# Patient Record
Sex: Female | Born: 2000 | Race: Black or African American | Hispanic: No | Marital: Single | Smoking: Never smoker
Health system: Southern US, Community
[De-identification: ages and names within clinical notes are randomized; demographics above are authoritative.]

---
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
## 2020-10-14 ENCOUNTER — Ambulatory Visit
Admission: EM | Admit: 2020-10-14 | Discharge: 2020-10-14 | Disposition: A | Discharge status: Home / Self Care | Payer: BC Managed Care – PPO | Attending: Urgent Care | Admitting: Urgent Care

## 2020-10-14 ENCOUNTER — Encounter: Payer: Self-pay | Admitting: *Deleted

## 2020-10-14 DIAGNOSIS — M2569 Stiffness of other specified joint, not elsewhere classified: Secondary | ICD-10-CM | POA: Diagnosis not present

## 2020-10-14 DIAGNOSIS — M545 Low back pain, unspecified: Secondary | ICD-10-CM

## 2020-10-14 MED ORDER — TIZANIDINE HCL 4 MG PO TABS: 4.0000 mg | ORAL_TABLET | Freq: Every day | ORAL | 0 refills | Status: DC

## 2020-10-14 MED ORDER — IBUPROFEN 800 MG PO TABS: 800.0000 mg | ORAL_TABLET | Freq: Three times a day (TID) | ORAL | 0 refills | Status: DC
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
## 2022-04-04 ENCOUNTER — Ambulatory Visit
Admission: EM | Admit: 2022-04-04 | Discharge: 2022-04-04 | Disposition: A | Discharge status: Home / Self Care | Payer: BC Managed Care – PPO | Attending: Physician Assistant | Admitting: Physician Assistant

## 2022-04-04 DIAGNOSIS — H00014 Hordeolum externum left upper eyelid: Secondary | ICD-10-CM | POA: Diagnosis not present

## 2022-04-04 MED ORDER — POLYMYXIN B-TRIMETHOPRIM 10000-0.1 UNIT/ML-% OP SOLN: 1.0000 [drp] | OPHTHALMIC | 0 refills | Status: AC

## 2022-04-04 NOTE — ED Provider Notes (Signed)
[facility]    CSN: [id number] Arrival date & time: 04/04/22  1132      History   Chief Complaint Chief Complaint  Patient presents with   Eye Problem    HPI Yvette Parker is a 22 y.o. female.   Patient here today for evaluation of swelling to her left upper eyelid that has been present for the last few days.  She reports she thinks she might have accidentally touched beauty products to her eye prior to symptoms starting.  She has had recurrent styes in the past.   The history is provided by the patient.  Eye Problem Associated symptoms: no discharge, no nausea, no photophobia, no redness and no vomiting     History reviewed. No pertinent past medical history.  There are no problems to display for this patient.   History reviewed. No pertinent surgical history.  OB History   No obstetric history on file.      Home Medications    Prior to Admission medications   Medication Sig Start Date End Date Taking? Authorizing Provider  trimethoprim-polymyxin b (POLYTRIM) ophthalmic solution Place 1 drop into the right eye every 4 (four) hours for 7 days. 04/04/22 04/11/22 Yes Francene Finders, PA-C  ibuprofen (ADVIL) 800 MG tablet Take 1 tablet (800 mg total) by mouth 3 (three) times daily. 10/14/20   Jaynee Eagles, PA-C  tiZANidine (ZANAFLEX) 4 MG tablet Take 1 tablet (4 mg total) by mouth at bedtime. 10/14/20   Jaynee Eagles, PA-C    Family History Family History  Problem Relation Age of Onset   Hypertension Mother    Hypertension Father     Social History Social History   Tobacco Use   Smoking status: Never   Smokeless tobacco: Never  Vaping Use   Vaping Use: Former  Substance Use Topics   Alcohol use: Yes    Comment: occasionally   Drug use: Never     Allergies   Omnicef [cefdinir]   Review of Systems Review of Systems  Constitutional:  Negative for chills, fatigue and fever.  HENT:  Negative for congestion and rhinorrhea.   Eyes:  Negative for  photophobia, pain, discharge, redness and visual disturbance.  Respiratory:  Negative for shortness of breath.   Gastrointestinal:  Negative for nausea and vomiting.     Physical Exam Triage Vital Signs ED Triage Vitals  Enc Vitals Group     BP 04/04/22 1141 (!) 140/84     Pulse Rate 04/04/22 1141 65     Resp 04/04/22 1141 18     Temp 04/04/22 1141 97.9 F (36.6 C)     Temp Source 04/04/22 1141 Oral     SpO2 04/04/22 1141 97 %     Weight --      Height --      Head Circumference --      Peak Flow --      Pain Score 04/04/22 1140 5     Pain Loc --      Pain Edu? --      Excl. in [place]? --    No data found.  Updated Vital Signs BP (!) 140/84 (BP Location: Left Arm)   Pulse 65   Temp 97.9 F (36.6 C) (Oral)   Resp 18   LMP 03/16/2022   SpO2 97%      Physical Exam Vitals and nursing note reviewed.  Constitutional:      General: She is not in acute distress.    Appearance:  Normal appearance. She is not ill-appearing.  HENT:     Head: Normocephalic and atraumatic.  Eyes:     Extraocular Movements: Extraocular movements intact.     Conjunctiva/sclera: Conjunctivae normal.     Pupils: Pupils are equal, round, and reactive to light.     Comments: Mild swelling and erythema appreciated to left upper eyelid  Cardiovascular:     Rate and Rhythm: Normal rate.  Pulmonary:     Effort: Pulmonary effort is normal. No respiratory distress.  Neurological:     Mental Status: She is alert.  Psychiatric:        Mood and Affect: Mood normal.        Behavior: Behavior normal.        Thought Content: Thought content normal.      UC Treatments / Results  Labs (all labs ordered are listed, but only abnormal results are displayed) Labs Reviewed - No data to display  EKG   Radiology No results found.  Procedures Procedures (including critical care time)  Medications Ordered in UC Medications - No data to display  Initial Impression / Assessment and Plan / UC Course   I have reviewed the triage vital signs and the nursing notes.  Pertinent labs & imaging results that were available during my care of the patient were reviewed by me and considered in my medical decision making (see chart for details).    Discussed suspected stye.  Will treat with antibiotic drops to cover secondary infection and encouraged alternating warm and cold compresses.  Given recurrent symptoms discussed possible evaluation by ophthalmology as well.  Encouraged follow-up with any further concerns.  Final Clinical Impressions(s) / UC Diagnoses   Final diagnoses:  Hordeolum externum of left upper eyelid   Discharge Instructions   None    ED Prescriptions     Medication Sig Dispense Auth. Provider   trimethoprim-polymyxin b (POLYTRIM) ophthalmic solution Place 1 drop into the right eye every 4 (four) hours for 7 days. 10 mL Francene Finders, PA-C      PDMP not reviewed this encounter.   Francene Finders, PA-C 04/04/22 1240

## 2022-04-04 NOTE — ED Triage Notes (Signed)
Pt presents with swelling on left eye with some moderate eye swelling on both bottom lids.

## 2023-03-23 ENCOUNTER — Emergency Department (HOSPITAL_COMMUNITY)
Admission: EM | Admit: 2023-03-23 | Discharge: 2023-03-24 | Disposition: A | Discharge status: Other Acute Inpatient Hospital | Payer: Managed Care, Other (non HMO) | Attending: Emergency Medicine | Admitting: Emergency Medicine

## 2023-03-23 ENCOUNTER — Ambulatory Visit (INDEPENDENT_AMBULATORY_CARE_PROVIDER_SITE_OTHER)
Admission: EM | Admit: 2023-03-23 | Discharge: 2023-03-23 | Disposition: A | Discharge status: Other Acute Inpatient Hospital | Payer: Managed Care, Other (non HMO) | Source: Home / Self Care

## 2023-03-23 DIAGNOSIS — F29 Unspecified psychosis not due to a substance or known physiological condition: Secondary | ICD-10-CM | POA: Diagnosis present

## 2023-03-23 DIAGNOSIS — Z1152 Encounter for screening for COVID-19: Secondary | ICD-10-CM | POA: Insufficient documentation

## 2023-03-23 DIAGNOSIS — R456 Violent behavior: Secondary | ICD-10-CM | POA: Diagnosis present

## 2023-03-23 DIAGNOSIS — Z91010 Allergy to peanuts: Secondary | ICD-10-CM | POA: Diagnosis not present

## 2023-03-23 DIAGNOSIS — Z91040 Latex allergy status: Secondary | ICD-10-CM | POA: Insufficient documentation

## 2023-03-23 DIAGNOSIS — Z046 Encounter for general psychiatric examination, requested by authority: Secondary | ICD-10-CM

## 2023-03-23 DIAGNOSIS — F1290 Cannabis use, unspecified, uncomplicated: Secondary | ICD-10-CM | POA: Insufficient documentation

## 2023-03-23 LAB — COMPREHENSIVE METABOLIC PANEL
ALT: 19 U/L (ref 0–44)
AST: 20 U/L (ref 15–41)
Albumin: 4.1 g/dL (ref 3.5–5.0)
Alkaline Phosphatase: 86 U/L (ref 38–126)
Anion gap: 13 (ref 5–15)
BUN: 7 mg/dL (ref 6–20)
CO2: 22 mmol/L (ref 22–32)
Calcium: 9.7 mg/dL (ref 8.9–10.3)
Chloride: 97 mmol/L — ABNORMAL LOW (ref 98–111)
Creatinine, Ser: 0.86 mg/dL (ref 0.44–1.00)
GFR, Estimated: >60 mL/min (ref >60)
Glucose, Bld: 71 mg/dL (ref 70–99)
Potassium: 4.2 mmol/L (ref 3.5–5.1)
Sodium: 132 mmol/L — ABNORMAL LOW (ref 135–145)
Total Bilirubin: 0.6 mg/dL (ref 0.0–1.2)
Total Protein: 7.9 g/dL (ref 6.5–8.1)

## 2023-03-23 LAB — ETHANOL: Alcohol, Ethyl (B): <10 mg/dL (ref <10)

## 2023-03-23 LAB — POCT URINE DRUG SCREEN - MANUAL ENTRY (I-SCREEN)
POC Amphetamine UR: NOT DETECTED
POC Buprenorphine (BUP): NOT DETECTED
POC Cocaine UR: NOT DETECTED
POC Marijuana UR: POSITIVE — AB
POC Methadone UR: NOT DETECTED
POC Methamphetamine UR: NOT DETECTED
POC Morphine: NOT DETECTED
POC Oxazepam (BZO): NOT DETECTED
POC Oxycodone UR: NOT DETECTED
POC Secobarbital (BAR): NOT DETECTED

## 2023-03-23 LAB — CBC WITH DIFFERENTIAL/PLATELET
Abs Immature Granulocytes: 0.04 10*3/uL (ref 0.00–0.07)
Basophils Absolute: 0 10*3/uL (ref 0.0–0.1)
Basophils Relative: 0 %
Eosinophils Absolute: 0.1 10*3/uL (ref 0.0–0.5)
Eosinophils Relative: 1 %
HCT: 42.8 % (ref 36.0–46.0)
Hemoglobin: 14.3 g/dL (ref 12.0–15.0)
Immature Granulocytes: 0 %
Lymphocytes Relative: 30 %
Lymphs Abs: 3 10*3/uL (ref 0.7–4.0)
MCH: 27.9 pg (ref 26.0–34.0)
MCHC: 33.4 g/dL (ref 30.0–36.0)
MCV: 83.6 fL (ref 80.0–100.0)
Monocytes Absolute: 0.8 10*3/uL (ref 0.1–1.0)
Monocytes Relative: 8 %
Neutro Abs: 6 10*3/uL (ref 1.7–7.7)
Neutrophils Relative %: 61 %
Platelets: 352 10*3/uL (ref 150–400)
RBC: 5.12 MIL/uL — ABNORMAL HIGH (ref 3.87–5.11)
RDW: 14.1 % (ref 11.5–15.5)
WBC: 10 10*3/uL (ref 4.0–10.5)
nRBC: 0 % (ref 0.0–0.2)

## 2023-03-23 LAB — HEMOGLOBIN A1C
Hgb A1c MFr Bld: 6.1 % — ABNORMAL HIGH (ref 4.8–5.6)
Mean Plasma Glucose: 128.37 mg/dL

## 2023-03-23 LAB — LIPID PANEL
Cholesterol: 169 mg/dL (ref 0–200)
HDL: 49 mg/dL (ref >40)
LDL Cholesterol: 107 mg/dL — ABNORMAL HIGH (ref 0–99)
Total CHOL/HDL Ratio: 3.4 {ratio}
Triglycerides: 65 mg/dL (ref <150)
VLDL: 13 mg/dL (ref 0–40)

## 2023-03-23 LAB — POC URINE PREG, ED: Preg Test, Ur: NEGATIVE

## 2023-03-23 LAB — TSH: TSH: 1.793 u[IU]/mL (ref 0.350–4.500)

## 2023-03-23 LAB — POCT PREGNANCY, URINE: Preg Test, Ur: NEGATIVE

## 2023-03-23 MED ORDER — OLANZAPINE 5 MG PO TABS
5.0000 mg | ORAL_TABLET | Freq: Every day | ORAL | Status: DC
  Administered 2023-03-24: 5 mg via ORAL
  Filled 2023-03-23: qty 1

## 2023-03-23 MED ORDER — LORAZEPAM 2 MG/ML IJ SOLN: 2.0000 mg | Freq: Three times a day (TID) | INTRAMUSCULAR | Status: DC | PRN

## 2023-03-23 MED ORDER — HALOPERIDOL LACTATE 5 MG/ML IJ SOLN: 5.0000 mg | Freq: Three times a day (TID) | INTRAMUSCULAR | Status: DC | PRN

## 2023-03-23 MED ORDER — TRAZODONE HCL 50 MG PO TABS: 50.0000 mg | ORAL_TABLET | Freq: Every evening | ORAL | Status: DC | PRN

## 2023-03-23 MED ORDER — LORAZEPAM 2 MG/ML IJ SOLN
2.0000 mg | Freq: Three times a day (TID) | INTRAMUSCULAR | Status: DC | PRN
  Administered 2023-03-23: 2 mg via INTRAMUSCULAR

## 2023-03-23 MED ORDER — ACETAMINOPHEN 325 MG PO TABS: 650.0000 mg | ORAL_TABLET | Freq: Four times a day (QID) | ORAL | Status: DC | PRN

## 2023-03-23 MED ORDER — LORAZEPAM 2 MG/ML IJ SOLN
2.0000 mg | Freq: Three times a day (TID) | INTRAMUSCULAR | Status: DC | PRN
  Filled 2023-03-23: qty 1

## 2023-03-23 MED ORDER — DIPHENHYDRAMINE HCL 50 MG/ML IJ SOLN: 50.0000 mg | Freq: Three times a day (TID) | INTRAMUSCULAR | Status: DC | PRN

## 2023-03-23 MED ORDER — OLANZAPINE 5 MG PO TBDP
5.0000 mg | ORAL_TABLET | Freq: Once | ORAL | Status: AC
  Administered 2023-03-23: 5 mg via ORAL
  Filled 2023-03-23: qty 1

## 2023-03-23 MED ORDER — DIPHENHYDRAMINE HCL 50 MG/ML IJ SOLN
50.0000 mg | Freq: Three times a day (TID) | INTRAMUSCULAR | Status: DC | PRN
  Administered 2023-03-23: 50 mg via INTRAMUSCULAR

## 2023-03-23 MED ORDER — OLANZAPINE 5 MG PO TBDP: 2.5000 mg | ORAL_TABLET | Freq: Once | ORAL | Status: DC

## 2023-03-23 MED ORDER — HALOPERIDOL LACTATE 5 MG/ML IJ SOLN: 10.0000 mg | Freq: Three times a day (TID) | INTRAMUSCULAR | Status: DC | PRN

## 2023-03-23 MED ORDER — ALUM & MAG HYDROXIDE-SIMETH 200-200-20 MG/5ML PO SUSP: 30.0000 mL | ORAL | Status: DC | PRN

## 2023-03-23 MED ORDER — MAGNESIUM HYDROXIDE 400 MG/5ML PO SUSP: 30.0000 mL | Freq: Every day | ORAL | Status: DC | PRN

## 2023-03-23 MED ORDER — DIPHENHYDRAMINE HCL 50 MG/ML IJ SOLN
50.0000 mg | Freq: Three times a day (TID) | INTRAMUSCULAR | Status: DC | PRN
  Filled 2023-03-23: qty 1

## 2023-03-23 MED ORDER — ALBUTEROL SULFATE HFA 108 (90 BASE) MCG/ACT IN AERS: 2.0000 | INHALATION_SPRAY | RESPIRATORY_TRACT | Status: DC | PRN

## 2023-03-23 MED ORDER — OLANZAPINE 5 MG PO TABS: 5.0000 mg | ORAL_TABLET | Freq: Every day | ORAL | Status: DC

## 2023-03-23 MED ORDER — OLANZAPINE 5 MG PO TBDP: 5.0000 mg | ORAL_TABLET | Freq: Three times a day (TID) | ORAL | Status: DC | PRN

## 2023-03-23 MED ORDER — HALOPERIDOL LACTATE 5 MG/ML IJ SOLN
10.0000 mg | Freq: Three times a day (TID) | INTRAMUSCULAR | Status: DC | PRN
  Administered 2023-03-23: 10 mg via INTRAMUSCULAR
  Filled 2023-03-23: qty 2

## 2023-03-23 NOTE — Progress Notes (Signed)
Patient has been denied by [facility] due to no appropriate beds available. Patient meets BH inpatient criteria per Liborio Nixon, NP. Patient has been faxed out to the following facilities:   [facility] [address] [place] [place] [address] [phone]  [facility] [address] [place] [address] [phone]  [facility] [address], [place] [address] [phone]  [facility] EFAX [address] [place] [phone]  [facility] [address], [place] [address] [phone]  [facility] Patient Placement [facility], [place] [phone]  [facility] [address] [place] [address] [phone]  [facility] [address] [place] [address] [phone]  [facility] [address] [place], [place] [address] [phone]  [facility] [address] [place] [address] [phone]  [facility] [address] [place] [address] [phone]  [facility] [address], [place] [address] [phone]  [facility] [address] [place] [address] [phone]  [facility] [address] [place] [address] [phone]  [facility] [address], [place] [address] [phone]  [facility] [address] [place] [address] [phone]  [facility] [address] [place] [address] [phone]  [facility] [place]  [place] [address] [phone]   Damita Dunnings, MSW, LCSW-A  6:42 PM 03/23/2023
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
## 2023-03-23 NOTE — Discharge Instructions (Addendum)
Transfer to [facility]
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
## 2023-03-23 NOTE — Progress Notes (Signed)
Pt is resting quietly. No distress noted or concerns voiced. Staff will monitor for pt's safety. ?

## 2023-03-23 NOTE — BH Assessment (Signed)
This Clinical research associate witnessed patient at approximately 1820 becoming agitated using profanity and could not be redirected. After several attempts were made to verbally deescalate patient began beating on the nurses station window followed by patient reaching through the opening and attempting to pull the phone along with the computer cords back through the window. Security was notified as further verbal deescalating techniques failed. Medications by provider were ordered at that time and patient was moved to flex for the safety of other patients. Security, this Theatre manager were present as multiple attempts were made to try and administer medications by talking to the patient although patient's behaviors continued to escalate as medications were administered.

## 2023-03-23 NOTE — Progress Notes (Signed)
Pt is IVCed and admitted to Continuous observation due to acute psychosis. Pt is alert and oriented to self with signs of AMS. Pt is ambulatory and is oriented to staff/unit. Pt cooperated with labs and skin assessment after a lot of encouragement. Pt's thought process is disorganized and is unable to answer questions appropriately. OTO of Olanzapine 5mg  was ordered and administered. Staff will monitor for pt's safety.

## 2023-03-23 NOTE — BH Assessment (Signed)
Comprehensive Clinical Assessment (CCA) Note  03/23/2023 Yvette Parker [id number]  DISPOSITION: Patient has been recommended for an inpatient admission to assist with stabilization, to note an IVC was initiated by provider soon after arrival due to patient's altered mental status.   The patient demonstrates the following risk factors for suicide: Chronic risk factors for suicide include: N/A. Acute risk factors for suicide include: N/A. Protective factors for this patient include: coping skills. Considering these factors, the overall suicide risk at this point appears to be low. Patient is not appropriate for outpatient follow up.   Patient is a 23 year old female that presents this date with her mother Caliope Ruppert [phone] who assists with providing collateral information. Patient initially presented voluntary although after evaluation it was determined that an IVC be initiated due to patient's altered mental state. Patient denies any SI, HI although reports ongoing AVH (see below provider note for description of content). Patient is observed to be altered at the time of assessment and renders limited information as this writer attempts to interact to gather additional information. Also patient was given medications on arrival and as this writer attempted to interact patient is observed to be drowsy.  Information to complete assessment was gathered from history and provider notes.  Per provider note White NP this date: On evaluation, patient is a poor historian. She is alert and partially oriented to self, and time. She is disoriented to place and states that she is at a pharmacy, although, she's at the [facility]. She is aware that she is in [place], [place]. She is disoriented to the month and states that the month is February, however, she is aware that it is year 2025. Patient is unable to answer questions appropriately and makes nonsensical statements. For example, when asked if she knows why she was  brought here for an evaluation today, she states, "tik-tok brought me here." Furthermore, she tell me that she knows who I am and that I am "Togo." She later tells me that her mother name is "Togo" whose's name is [name]. She expresses feeling paranoid and states, "I knew that bitch was crazy." She reports hearing voices and seeing things of "discernment." Patient noted to be laughing inappropriately on exam. Although, patient is acutely psychotic, she has been cooperative and has not displayed any aggressive behaviors. Due to the patient's inability to complete the assessment consent was obtain from the patient to speak with her mother to obtain collateral information.    I spoke to the patient's mother face to face to obtain collateral information.The patient's mother states that this is not the patient at all. She states that the patient's change in behaviors started either Tuesday or Wednesday of this week. She states that the patient has been super focused scattered and has not been sleeping. She states that the patient has been making statements about she is saved and knows her purpose. She states that on Thursday the patient told her and her husband that she needed to help her friends and was talking really fast, manic-like. She states that the patient has been talking all over the place and not making sense. 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 She states that for the past 2 days the patient has been leaving the house and she has been having to go and look for her. She states that on Saturday  the patient left the house and went to the [facility] which is a recreational center and said that she was talking to God. She states that the patient has not been eating as usual and that she believes she last are Friday night. She states that the patient has been smoking  marijuana for the past 2 to 3 years and that she smokes at least 2-3 times per day. She states that the patient has been focused on saying "tikTok bitches."  She states that the patient also told her you will find out when the time comes I have to complete a mission. She states that this morning the patient jumped into the neighbor's car and refused to get out this morning and that they did not know her whereabouts and was looking for her. She states that she received a phone call from the neighbor this morning around 9:57 AM asking what is wrong with the patient. She states that the patient has never been diagnosed with a psychiatric disorder. She denies a family psychiatric history. I dicussed with the patient's mother that according to the patient's chart she was prescribed prednisone on 1/15 for bronchitis which could induce mania or the patient's marijuana use could induce psychosis. I discussed with the patient's mother placing the patient under involuntary commitment and recommending inpatient psychiatric treatment. The patient's mother is agreeable to the stated plan of care.   IVC filed by this provider.  Respondent presents with altered mental status and her thought process is disorganized. Respondent is unable to answer questions appropriately and states, "tik-tok brought me here." Respondent refers to [name] whose's name is [name]. Respondent expresses feeling paranoid and states, "I knew that bitch was crazy." Respondent reports hearing voices and seeing things of "discernment."  Respondent has been leaving the house and missing over the course of two days. Today, respondent jumped into the neighbor's car and refused to get out.  Meanwhile, the respondent's family was looking for her as they did not know her whereabouts. The respondent has been frequently stating "you will find out when the time comes I have to complete the mission." Respondent has not been sleeping and  leaving the house at night without her family knowing where she is. Respondent is a danger to herself and others.      Chief Complaint:  Chief Complaint  Patient presents with   Altered Mental Status   Visit Diagnosis: Unspecified Psychosis     CCA Screening, Triage and Referral (STR)  Patient Reported Information How did you hear about [place]? Self  What Is the Reason for Your Visit/Call Today? Yvette Parker presents to [facility] voluntarily accompanied by her mother. Pt states that she is here because her mother bought her here. Pt states that her mother thinks that something is wrong. Pt currently denies SI, HI, AH and alcohol/drug use. Pt admits to [facility], stating that she sees a vision of the past, but is trying to use for the future. Pt continues to hold up her locker color sheet, stating that it is her commitment papers. Per mom, pt hasn't slept in 4-5 days and uses marijuana daily. Per mom, pt has been having manic episodes.  How Long Has This Been Causing You Problems? 1 wk - 1 month  What Do You Feel Would Help You the Most Today? Treatment for Depression or other mood problem   Have You Recently Had Any Thoughts About Hurting Yourself? No  Are You Planning to Commit Suicide/Harm  Yourself At This time? No   Flowsheet Row ED from 03/23/2023 in [facility] ED from 04/04/2022 in [facility] at [facility] ED from 10/14/2020 in [facility] at [facility])  C-SSRS RISK CATEGORY No Risk No Risk No Risk       Have you Recently Had Thoughts About Hurting Someone Karolee Ohs? No  Are You Planning to Harm Someone at This Time? No  Explanation: NA   Have You Used Any Alcohol or Drugs in the Past 24 Hours? Yes  How Long Ago Did You Use Drugs or Alcohol? Patient reports in the last 24 hours  What Did You Use and How Much? Pt renders limited information and is observed to be altered at the time of assessment. Patient  reports she, "has only smoked weed once and that was yesterday."   Do You Currently Have a Therapist/Psychiatrist? No  Name of Therapist/Psychiatrist:  NA  Have You Been Recently Discharged From Any Office Practice or Programs? No  Explanation of Discharge From Practice/Program: NA    CCA Screening Triage Referral Assessment Type of Contact: Face-to-Face  Telemedicine Service Delivery: 03/23/2023  Is this Initial or Reassessment? Initial  Date Telepsych consult ordered in [facility]:  03/22/2023  Time Telepsych consult ordered in [facility]: 1400   Location of Assessment: [facility] Assessment Services  Provider Location: [facility] Assessment Services   Collateral Involvement: Mother who is present   Does Patient Have a Automotive engineer Guardian? No  Legal Guardian Contact Information: NA  Copy of Legal Guardianship Form: -- (NA)  Legal Guardian Notified of Arrival: -- (NA)  Legal Guardian Notified of Pending Discharge: -- (NA)  If Minor and Not Living with Parent(s), Who has Custody? NA  Is CPS involved or ever been involved? Never  Is APS involved or ever been involved? Never   Patient Determined To Be At Risk for Harm To Self or Others Based on Review of Patient Reported Information or Presenting Complaint? No  Method: No Plan  Availability of Means: No access or NA  Intent: Vague intent or NA  Notification Required: No need or identified person  Additional Information for Danger to Others Potential: Previous attempts (NA)  Additional Comments for Danger to Others Potential: None noted  Are There Guns or Other Weapons in Your Home? No  Types of Guns/Weapons: NA  Are These Weapons Safely Secured?                            -- (NA)  Who Could Verify You Are Able To Have These Secured: NA  Do You Have any Outstanding Charges, Pending Court Dates, Parole/Probation? Pt denies  Contacted To Inform of Risk of Harm To Self or Others: Other: Comment (NA)    Does  Patient Present under Involuntary Commitment? No (Although IVC was initiated after assessment)    [place] of Residence: [place]   Patient Currently Receiving the Following Services: Not Receiving Services   Determination of Need: Urgent (48 hours)   Options For Referral: Inpatient Hospitalization     CCA Biopsychosocial Patient Reported Schizophrenia/Schizoaffective Diagnosis in Past: No   Strengths: UTA due to patient's AMS on arrival   Mental Health Symptoms Depression:  -- (UTA)   Duration of Depressive symptoms:    Mania:  -- (UTA)   Anxiety:   -- (UTA)   Psychosis:  Hallucinations   Duration of Psychotic symptoms: Duration of  Psychotic Symptoms: Less than six months   Trauma:  None   Obsessions:  None   Compulsions:  None   Inattention:  None   Hyperactivity/Impulsivity:  None   Oppositional/Defiant Behaviors:  None   Emotional Irregularity:  None   Other Mood/Personality Symptoms:  Patient has been altered per mother for last 72 hours    Mental Status Exam Appearance and self-care  Stature:  Average   Weight:  Average weight   Clothing:  Casual   Grooming:  Normal   Cosmetic use:  None   Posture/gait:  Bizarre   Motor activity:  Restless   Sensorium  Attention:  Distractible   Concentration:  Scattered   Orientation:  Person   Recall/memory:  -- (UTA)   Affect and Mood  Affect:  Anxious   Mood:  Anxious   Relating  Eye contact:  Fleeting   Facial expression:  Anxious   Attitude toward examiner:  Cooperative   Thought and Language  Speech flow: Flight of Ideas   Thought content:  Delusions   Preoccupation:  None   Hallucinations:  Auditory; Visual   Organization:  Disorganized   Company secretary of Knowledge:  Poor   Intelligence:  Needs investigation   Abstraction:  Abstract   Judgement:  Impaired   Reality Testing:  Unaware   Insight:  Poor   Decision Making:  Only simple   Social  Functioning  Social Maturity:  Responsible   Social Judgement:  Normal   Stress  Stressors:  -- Industrial/product designer)   Coping Ability:  Overwhelmed   Skill Deficits:  -- Industrial/product designer)   Supports:  Family     Religion: Religion/Spirituality Are You A Religious Person?: No How Might This Affect Treatment?: NA  Leisure/Recreation: Leisure / Recreation Do You Have Hobbies?: No  Exercise/Diet: Exercise/Diet Do You Exercise?: No Have You Gained or Lost A Significant Amount of Weight in the Past Six Months?: No Do You Follow a Special Diet?: No Do You Have Any Trouble Sleeping?: No   CCA Employment/Education Employment/Work Situation: Employment / Work Situation Employment Situation: Employed Work Stressors: Production manager has Been Impacted by Current Illness:  (UTA) Has Patient ever Been in Equities trader?: No  Education: Education Is Patient Currently Attending School?: No Last Grade Completed: 12 Did You Product manager?: Yes What Type of College Degree Do you Have?: Degree in social work Did You Have An Individualized Education Program (IIEP): No Did You Have Any Difficulty At [employer]?: No Patient's Education Has Been Impacted by Current Illness: No   CCA Family/Childhood History Family and Relationship History: Family history Marital status: Single Does patient have children?: No  Childhood History:  Childhood History By whom was/is the patient raised?: Both parents Did patient suffer any verbal/emotional/physical/sexual abuse as a child?: No Did patient suffer from severe childhood neglect?: No Has patient ever been sexually abused/assaulted/raped as an adolescent or adult?: No Was the patient ever a victim of a crime or a disaster?: No Witnessed domestic violence?: No Has patient been affected by domestic violence as an adult?: No       CCA Substance Use Alcohol/Drug Use: Alcohol / Drug Use Pain Medications: See MAR Prescriptions: See MAR Over the Counter: See  MAR History of alcohol / drug use?: Yes Longest period of sobriety (when/how long): UTA Negative Consequences of Use:  (UTA) Withdrawal Symptoms: None Substance #1 Name of Substance 1: Cannabis 1 - Age of First Use: Pt states she "used weed" only once 24  hours ago 1 - Amount (size/oz): UTA 1 - Frequency: UTA 1 - Duration: UTA 1 - Last Use / Amount: In the last 24 hours unknown amount 1 - Method of Aquiring: UTA 1- Route of Use: Smoking                       ASAM's:  Six Dimensions of Multidimensional Assessment  Dimension 1:  Acute Intoxication and/or Withdrawal Potential:   Dimension 1:  Description of individual's past and current experiences of substance use and withdrawal: NA  Dimension 2:  Biomedical Conditions and Complications:   Dimension 2:  Description of patient's biomedical conditions and  complications: NA  Dimension 3:  Emotional, Behavioral, or Cognitive Conditions and Complications:  Dimension 3:  Description of emotional, behavioral, or cognitive conditions and complications: NA  Dimension 4:  Readiness to Change:  Dimension 4:  Description of Readiness to Change criteria: NA  Dimension 5:  Relapse, Continued use, or Continued Problem Potential:  Dimension 5:  Relapse, continued use, or continued problem potential critiera description: NA  Dimension 6:  Recovery/Living Environment:  Dimension 6:  Recovery/Iiving environment criteria description: NA  ASAM Severity Score:    ASAM Recommended Level of Treatment: ASAM Recommended Level of Treatment:  (NA)   Substance use Disorder (SUD) Substance Use Disorder (SUD)  Checklist Symptoms of Substance Use:  (NA)  Recommendations for Services/Supports/Treatments: Recommendations for Services/Supports/Treatments Recommendations For Services/Supports/Treatments: Inpatient Hospitalization  Disposition Recommendation per psychiatric provider: We recommend inpatient psychiatric hospitalization after medical  hospitalization. Patient has been involuntarily committed on 03/23/2023.    DSM5 Diagnoses: There are no active problems to display for this patient.    Referrals to Alternative Service(s): Referred to Alternative Service(s):   Place:   Date:   Time:    Referred to Alternative Service(s):   Place:   Date:   Time:    Referred to Alternative Service(s):   Place:   Date:   Time:    Referred to Alternative Service(s):   Place:   Date:   Time:     Alfredia Ferguson, LCAS

## 2023-03-23 NOTE — Progress Notes (Signed)
Report given Sheria Lang, RN CN [facility].

## 2023-03-23 NOTE — Progress Notes (Cosign Needed Addendum)
Provider was notified that the patient was verbally and physically aggressive on the unit. On evaluation, the patient was observed on the unit pacing yelling, screaming and threatening staff and other patients. Patient was moved to the flex area away from other patients. Patient physically aggressive and was put into a manual hold for chemical restraint and was administered Haldol 10, Ativan 2 and Benadryl 50 intramuscular for severe agitation.  Given the patient's inability to engage in urgent care-level interventions, patient requires a higher level of care for further medical and psychiatric stabilization.  The patient's current presentation exceeds the resources and scope of our urgent care facility. Patient to be transferred to the [facility]. Report called to Dr. Clydene Laming at [facility]. Patient is  under IVC.  Due to patient experiencing first psychotic episode would recommend head CT to rule out organic causes. Patient may be experiencing mania or psychosis from recent prednisone regimen for bronchitis prescribed on 03/12/23.

## 2023-03-23 NOTE — ED Provider Notes (Signed)
[place] EMERGENCY DEPARTMENT AT [facility] Provider Note   CSN: [id number] Arrival date & time: 03/23/23  1914     History  Chief Complaint  Patient presents with   Aggressive Behavior    Yvette Parker is a 23 y.o. female  With new onset psychosis who was seen at behavioral health urgent care earlier today and deemed medically clear.  The patient was went into the emergency department after she became aggressive required Haldol 10 mg 2 of Ativan and 50 about Medrol IM she was transferred to the emergency department because her level of aggression was deemed too high for urgent care facility.  Patient currently sleeping in bed after calming medications.  She is currently under involuntary commitment by previous provider.  HPI     Home Medications Prior to Admission medications   Medication Sig Start Date End Date Taking? Authorizing Provider  albuterol (VENTOLIN HFA) 108 (90 Base) MCG/ACT inhaler Inhale 2 puffs into the lungs every 4 (four) hours as needed for wheezing or shortness of breath. 06/19/21   [provider]      Allergies    Lactose intolerance (gi), Red dye, Omnicef [cefdinir], Latex, and Peanut-containing drug products    Review of Systems   Review of Systems  Physical Exam Updated Vital Signs BP 126/72   Pulse 87   Temp 98.2 F (36.8 C) (Oral)   Resp 18   SpO2 100%  Physical Exam Vitals and nursing note reviewed.  Constitutional:      General: She is not in acute distress.    Appearance: She is well-developed. She is not diaphoretic.     Comments: Patient sleeping soundly in the hallway  HENT:     Head: Normocephalic and atraumatic.     Right Ear: External ear normal.     Left Ear: External ear normal.     Nose: Nose normal.     Mouth/Throat:     Mouth: Mucous membranes are moist.  Eyes:     General: No scleral icterus.    Conjunctiva/sclera: Conjunctivae normal.  Cardiovascular:     Rate and Rhythm: Normal rate and regular  rhythm.     Heart sounds: Normal heart sounds. No murmur heard.    No friction rub. No gallop.  Pulmonary:     Effort: Pulmonary effort is normal. No respiratory distress.     Breath sounds: Normal breath sounds.  Abdominal:     General: Bowel sounds are normal. There is no distension.     Palpations: Abdomen is soft. There is no mass.     Tenderness: There is no abdominal tenderness. There is no guarding.  Musculoskeletal:     Cervical back: Normal range of motion.  Skin:    General: Skin is warm and dry.  Neurological:     Mental Status: She is oriented to person, place, and time.  Psychiatric:        Behavior: Behavior normal.     ED Results / Procedures / Treatments   Labs (all labs ordered are listed, but only abnormal results are displayed) Labs Reviewed - No data to display Results for orders placed or performed during the hospital encounter of 03/23/23  CBC with Differential/Platelet   Collection Time: 03/23/23  1:39 PM  Result Value Ref Range   WBC 10.0 4.0 - 10.5 K/uL   RBC 5.12 (H) 3.87 - 5.11 MIL/uL   Hemoglobin 14.3 12.0 - 15.0 g/dL   HCT 78.2 95.6 - 21.3 %  MCV 83.6 80.0 - 100.0 fL   MCH 27.9 26.0 - 34.0 pg   MCHC 33.4 30.0 - 36.0 g/dL   RDW 18.8 41.6 - 60.6 %   Platelets 352 150 - 400 K/uL   nRBC 0.0 0.0 - 0.2 %   Neutrophils Relative % 61 %   Neutro Abs 6.0 1.7 - 7.7 K/uL   Lymphocytes Relative 30 %   Lymphs Abs 3.0 0.7 - 4.0 K/uL   Monocytes Relative 8 %   Monocytes Absolute 0.8 0.1 - 1.0 K/uL   Eosinophils Relative 1 %   Eosinophils Absolute 0.1 0.0 - 0.5 K/uL   Basophils Relative 0 %   Basophils Absolute 0.0 0.0 - 0.1 K/uL   Immature Granulocytes 0 %   Abs Immature Granulocytes 0.04 0.00 - 0.07 K/uL  Comprehensive metabolic panel   Collection Time: 03/23/23  1:39 PM  Result Value Ref Range   Sodium 132 (L) 135 - 145 mmol/L   Potassium 4.2 3.5 - 5.1 mmol/L   Chloride 97 (L) 98 - 111 mmol/L   CO2 22 22 - 32 mmol/L   Glucose, Bld 71 70 - 99  mg/dL   BUN 7 6 - 20 mg/dL   Creatinine, Ser 3.01 0.44 - 1.00 mg/dL   Calcium 9.7 8.9 - 60.1 mg/dL   Total Protein 7.9 6.5 - 8.1 g/dL   Albumin 4.1 3.5 - 5.0 g/dL   AST 20 15 - 41 U/L   ALT 19 0 - 44 U/L   Alkaline Phosphatase 86 38 - 126 U/L   Total Bilirubin 0.6 0.0 - 1.2 mg/dL   GFR, Estimated >09 >32 mL/min   Anion gap 13 5 - 15  Hemoglobin A1c   Collection Time: 03/23/23  1:39 PM  Result Value Ref Range   Hgb A1c MFr Bld 6.1 (H) 4.8 - 5.6 %   Mean Plasma Glucose 128.37 mg/dL  Ethanol   Collection Time: 03/23/23  1:39 PM  Result Value Ref Range   Alcohol, Ethyl (B) <10 <10 mg/dL  Lipid panel   Collection Time: 03/23/23  1:39 PM  Result Value Ref Range   Cholesterol 169 0 - 200 mg/dL   Triglycerides 65 <355 mg/dL   HDL 49 >73 mg/dL   Total CHOL/HDL Ratio 3.4 RATIO   VLDL 13 0 - 40 mg/dL   LDL Cholesterol 220 (H) 0 - 99 mg/dL  TSH   Collection Time: 03/23/23  1:39 PM  Result Value Ref Range   TSH 1.793 0.350 - 4.500 uIU/mL  POCT Urine Drug Screen - (I-Screen)   Collection Time: 03/23/23  1:46 PM  Result Value Ref Range   POC Amphetamine UR None Detected NONE DETECTED (Cut Off Level 1000 ng/mL)   POC Secobarbital (BAR) None Detected NONE DETECTED (Cut Off Level 300 ng/mL)   POC Buprenorphine (BUP) None Detected NONE DETECTED (Cut Off Level 10 ng/mL)   POC Oxazepam (BZO) None Detected NONE DETECTED (Cut Off Level 300 ng/mL)   POC Cocaine UR None Detected NONE DETECTED (Cut Off Level 300 ng/mL)   POC Methamphetamine UR None Detected NONE DETECTED (Cut Off Level 1000 ng/mL)   POC Morphine None Detected NONE DETECTED (Cut Off Level 300 ng/mL)   POC Methadone UR None Detected NONE DETECTED (Cut Off Level 300 ng/mL)   POC Oxycodone UR None Detected NONE DETECTED (Cut Off Level 100 ng/mL)   POC Marijuana UR Positive (A) NONE DETECTED (Cut Off Level 50 ng/mL)  POC urine preg, ED   Collection Time:  03/23/23  1:47 PM  Result Value Ref Range   Preg Test, Ur Negative Negative   Pregnancy, urine POC   Collection Time: 03/23/23  1:49 PM  Result Value Ref Range   Preg Test, Ur NEGATIVE NEGATIVE    EKG None  Radiology No results found.  Procedures Procedures    Medications Ordered in ED Medications  OLANZapine (ZYPREXA) tablet 5 mg (has no administration in time range)  acetaminophen (TYLENOL) tablet 650 mg (has no administration in time range)  alum & mag hydroxide-simeth (MAALOX/MYLANTA) 200-200-20 MG/5ML suspension 30 mL (has no administration in time range)  magnesium hydroxide (MILK OF MAGNESIA) suspension 30 mL (has no administration in time range)  traZODone (DESYREL) tablet 50 mg (has no administration in time range)  haloperidol lactate (HALDOL) injection 10 mg (has no administration in time range)    And  diphenhydrAMINE (BENADRYL) injection 50 mg (has no administration in time range)    And  LORazepam (ATIVAN) injection 2 mg (has no administration in time range)  haloperidol lactate (HALDOL) injection 5 mg (has no administration in time range)    And  diphenhydrAMINE (BENADRYL) injection 50 mg (has no administration in time range)    And  LORazepam (ATIVAN) injection 2 mg (has no administration in time range)  OLANZapine zydis (ZYPREXA) disintegrating tablet 5 mg (has no administration in time range)    ED Course/ Medical Decision Making/ A&P                                 Medical Decision Making  Patient's labs were reviewed.  She will likely need psychiatric stabilization.  She is currently under involuntary commitment.  She is medically clear.        Final Clinical Impression(s) / ED Diagnoses Final diagnoses:  None    Rx / DC Orders ED Discharge Orders          Ordered    Place in psych hold        03/23/23 2219              Arthor Captain, PA-C 03/23/23 2228    Alvira Monday, MD 03/24/23 [phone]
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
## 2023-03-23 NOTE — ED Triage Notes (Signed)
Pt BIB [facility] from [facility]. Pt IVC, pt requested to come to [place]. Py being aggressive with staff at [facility].

## 2023-03-23 NOTE — Progress Notes (Signed)
[facility] called for transport

## 2023-03-24 ENCOUNTER — Emergency Department (HOSPITAL_COMMUNITY): Payer: Managed Care, Other (non HMO)

## 2023-03-24 ENCOUNTER — Encounter (HOSPITAL_COMMUNITY): Payer: Self-pay | Admitting: Behavioral Health

## 2023-03-24 LAB — RESP PANEL BY RT-PCR (RSV, FLU A&B, COVID)  RVPGX2
Influenza A by PCR: NEGATIVE
Influenza B by PCR: NEGATIVE
Resp Syncytial Virus by PCR: NEGATIVE
SARS Coronavirus 2 by RT PCR: NEGATIVE

## 2023-03-24 LAB — WET PREP, GENITAL
Clue Cells Wet Prep HPF POC: NONE SEEN
Sperm: NONE SEEN
WBC, Wet Prep HPF POC: >=10 — AB (ref <10)
Yeast Wet Prep HPF POC: NONE SEEN

## 2023-03-24 MED ORDER — METRONIDAZOLE 500 MG PO TABS
2000.0000 mg | ORAL_TABLET | Freq: Once | ORAL | Status: AC
  Administered 2023-03-24: 2000 mg via ORAL
  Filled 2023-03-24: qty 4

## 2023-03-24 MED ORDER — CEFTRIAXONE SODIUM 1 G IJ SOLR
500.0000 mg | Freq: Once | INTRAMUSCULAR | Status: AC
  Administered 2023-03-24: 500 mg via INTRAMUSCULAR
  Filled 2023-03-24: qty 10

## 2023-03-24 MED ORDER — LIDOCAINE HCL 1 % IJ SOLN
INTRAMUSCULAR | Status: AC
  Administered 2023-03-24: 2.1 mL
  Filled 2023-03-24: qty 20

## 2023-03-24 MED ORDER — AZITHROMYCIN 250 MG PO TABS
1000.0000 mg | ORAL_TABLET | Freq: Once | ORAL | Status: AC
  Administered 2023-03-24: 1000 mg via ORAL
  Filled 2023-03-24: qty 4

## 2023-03-24 NOTE — Progress Notes (Signed)
LCSW Progress Note  [id number]   Yvette Parker  03/24/2023  9:24 AM  Description:   Inpatient Psychiatric Referral  Patient was recommended inpatient per [facility]. There are no available beds at [facility], per [facility] Rona Ravens RN.Patient was referred to the following out of network facilities:    Destination  Service Provider Address Phone Fax  [facility] [address] [place] [address] [phone]  [facility] [address] [place] [address] [phone]  [facility] [address] [place] [address] [phone]  [facility] [address] [employer], [place] [address] [phone]  [facility] [address] [place] [address] [phone]  [facility] [address], [place] [address] [phone]  [facility] [address], [place] [address] [phone]  [facility] [address], [place] [address] [phone]  [facility] [address] [place] [address] [phone]  [facility] [address] [place] [address] [phone]  [facility] [place] [phone]  [facility] EFAX [address] [place], [place] [phone]  [facility] [address], [place] [address] [phone]  [facility] [address] [place] [address] [phone]  [facility] [address], [place] [address] [phone]      Situation ongoing, CSW to continue following and update chart as more information becomes available.     [place] Lakynn Halvorsen, MSW, LCSW   03/24/2023 9:24 AM

## 2023-03-24 NOTE — ED Provider Notes (Addendum)
I was notified by nursing staff that the patient was concerned that she possibly had a sexually transmitted disease.  Patient was actually seen less than 48 hours ago at a clinic where it sounds like she was unable or unwilling to have a sample obtained.  I will test and treat her presumptively here.   Patient has been accepted at [facility].  Transport is now here to take her.     Melene Plan, DO 03/24/23 1326

## 2023-03-24 NOTE — ED Notes (Signed)
Spoke on the phone with pt mother. Pt mother requesting pt inpatient admission be paused until she has seen the pt later this morning. Mother also requesting pt admission facility be closer to the area.

## 2023-03-24 NOTE — Progress Notes (Signed)
Pt has been accepted to [facility] assignment: 5 WEST   Pt meets inpatient criteria per: Ophelia Shoulder NP  Attending Physician will be: Dr. Sarita Bottom, MD   Report can be called to: [phone]  Pt can arrive ASAP   Care Team Notified:  Melene Plan DO, Tia Alert RN, Dr. Gasper Sells MD, Ophelia Shoulder NP    [place] Elizabelle Fite LCSW-A   03/24/2023 11:52 AM

## 2023-03-24 NOTE — ED Notes (Signed)
Patient gave a verbal permission to speak with her mom ( Dannene Odessa), dad Berna Gitto), and sister Katrisha Segall) about her care and plan to [employer] [facility]

## 2023-03-24 NOTE — ED Notes (Signed)
Pt has been instructed to dress out in order to go back to [facility]. Earlier she was then somehow received her clothes and put them back on.

## 2023-03-24 NOTE — ED Notes (Signed)
Called report to Rozell Searing  at [facility]

## 2023-03-25 LAB — GC/CHLAMYDIA PROBE AMP (~~LOC~~) NOT AT ARMC
Chlamydia: NEGATIVE
Comment: NEGATIVE
Comment: NORMAL
Neisseria Gonorrhea: NEGATIVE
